# Patient Record
Sex: Female | Born: 1955 | Race: White | Hispanic: No | Marital: Married | Smoking: Never smoker
Health system: Southern US, Community
[De-identification: ages and names within clinical notes are randomized; demographics above are authoritative.]

## PROBLEM LIST (undated history)

## (undated) DIAGNOSIS — M797 Fibromyalgia: Secondary | ICD-10-CM

## (undated) DIAGNOSIS — M1990 Unspecified osteoarthritis, unspecified site: Secondary | ICD-10-CM

## (undated) DIAGNOSIS — K589 Irritable bowel syndrome without diarrhea: Secondary | ICD-10-CM

---

## 2003-03-19 ENCOUNTER — Encounter: Admission: RE | Admit: 2003-03-19 | Discharge: 2003-03-19 | Payer: Self-pay | Admitting: Allergy and Immunology

## 2003-03-19 ENCOUNTER — Encounter: Payer: Self-pay | Admitting: Allergy and Immunology

## 2005-01-18 ENCOUNTER — Encounter: Admission: RE | Admit: 2005-01-18 | Discharge: 2005-01-18 | Payer: Self-pay | Admitting: Gastroenterology

## 2005-03-06 ENCOUNTER — Ambulatory Visit (HOSPITAL_COMMUNITY): Admission: RE | Admit: 2005-03-06 | Discharge: 2005-03-06 | Payer: Self-pay | Admitting: Gastroenterology

## 2005-12-06 ENCOUNTER — Encounter: Admission: RE | Admit: 2005-12-06 | Discharge: 2005-12-06 | Payer: Self-pay | Admitting: *Deleted

## 2008-03-29 ENCOUNTER — Encounter: Admission: RE | Admit: 2008-03-29 | Discharge: 2008-03-29 | Payer: Self-pay | Admitting: Internal Medicine

## 2010-11-17 NOTE — Op Note (Signed)
NAME:  Courtney Griffin, Courtney Griffin               ACCOUNT NO.:  [id number]   MEDICAL RECORD NO.:  [id number]          PATIENT TYPE:  AMB   LOCATION:  ENDO                         FACILITY:  [facility]   PHYSICIAN:  James L. Malon Kindle., M.D.DATE OF BIRTH:  1956/02/22   DATE OF PROCEDURE:  03/06/2005  DATE OF DISCHARGE:                                 OPERATIVE REPORT   PROCEDURE:  Esophagogastroduodenoscopy.   MEDICATIONS:  Fentanyl 40 mcg, Versed 3 mg, IV.   INDICATION:  Persistent esophageal reflux.   DESCRIPTION OF PROCEDURE:  The procedure explained to the patient and  consent obtained.  Left lateral decubitus position, the scope was inserted  and advanced.  The stomach was entered.  Pylorus identified and passed.  The  duodenum, duodenal bulb and the second portion were seen well and were non-  remarkable.  The pyloric channel, antrum and body were all seen well and  were normal.  No ulceration or inflammation.  There was really only a slight  if any hiatal hernia.  No Barrett's esophagus.  A widely patent GE junction  with no stricture.  Distal esophagus was slightly red.  The scope was  withdrawn, and the proximal esophagus was normal.   ASSESSMENT:  Gastroesophageal reflux disease, 530.81.   PLAN:  1.  We are going to give a reflux sheet.  2.  Continue on current medications, specifically, PPI b.i.d.  3.  We will see back in the office in four to six weeks.           ______________________________  Llana Aliment Malon Kindle., M.D.     Waldron Session  D:  03/06/2005  T:  03/06/2005  Job:  [id number]   cc:   Joycelyn Rua, M.D.  [address] [place]  [place] [address]  Fax: [phone]

## 2014-06-10 ENCOUNTER — Other Ambulatory Visit: Payer: Self-pay | Admitting: Family Medicine

## 2014-06-10 DIAGNOSIS — J3489 Other specified disorders of nose and nasal sinuses: Secondary | ICD-10-CM

## 2014-06-11 ENCOUNTER — Other Ambulatory Visit: Payer: Self-pay | Admitting: Family Medicine

## 2014-06-11 ENCOUNTER — Ambulatory Visit (INDEPENDENT_AMBULATORY_CARE_PROVIDER_SITE_OTHER): Payer: BC Managed Care – PPO

## 2014-06-11 DIAGNOSIS — J3489 Other specified disorders of nose and nasal sinuses: Secondary | ICD-10-CM

## 2014-06-11 DIAGNOSIS — R51 Headache: Secondary | ICD-10-CM

## 2018-01-15 ENCOUNTER — Other Ambulatory Visit: Payer: Self-pay | Admitting: Physician Assistant

## 2018-01-15 DIAGNOSIS — Z1239 Encounter for other screening for malignant neoplasm of breast: Secondary | ICD-10-CM

## 2018-01-22 ENCOUNTER — Ambulatory Visit (INDEPENDENT_AMBULATORY_CARE_PROVIDER_SITE_OTHER): Payer: BLUE CROSS/BLUE SHIELD

## 2018-01-22 DIAGNOSIS — Z1231 Encounter for screening mammogram for malignant neoplasm of breast: Secondary | ICD-10-CM | POA: Diagnosis not present

## 2018-01-22 DIAGNOSIS — Z1239 Encounter for other screening for malignant neoplasm of breast: Secondary | ICD-10-CM

## 2018-03-14 ENCOUNTER — Other Ambulatory Visit: Payer: Self-pay

## 2018-03-14 ENCOUNTER — Encounter (HOSPITAL_COMMUNITY): Payer: Self-pay | Admitting: *Deleted

## 2018-03-14 ENCOUNTER — Emergency Department (HOSPITAL_COMMUNITY)
Admission: EM | Admit: 2018-03-14 | Discharge: 2018-03-14 | Disposition: A | Discharge status: Home / Self Care | Payer: BLUE CROSS/BLUE SHIELD | Attending: Emergency Medicine | Admitting: Emergency Medicine

## 2018-03-14 ENCOUNTER — Emergency Department (HOSPITAL_COMMUNITY): Payer: BLUE CROSS/BLUE SHIELD

## 2018-03-14 DIAGNOSIS — M79602 Pain in left arm: Secondary | ICD-10-CM

## 2018-03-14 DIAGNOSIS — M5412 Radiculopathy, cervical region: Secondary | ICD-10-CM

## 2018-03-14 HISTORY — DX: Irritable bowel syndrome, unspecified: K58.9

## 2018-03-14 HISTORY — DX: Fibromyalgia: M79.7

## 2018-03-14 HISTORY — DX: Unspecified osteoarthritis, unspecified site: M19.90

## 2018-03-14 LAB — BASIC METABOLIC PANEL
ANION GAP: 10 (ref 5–15)
BUN: 9 mg/dL (ref 6–20)
CALCIUM: 9.3 mg/dL (ref 8.9–10.3)
CO2: 27 mmol/L (ref 22–32)
Chloride: 103 mmol/L (ref 98–111)
Creatinine, Ser: 0.94 mg/dL (ref 0.44–1.00)
Glucose, Bld: 91 mg/dL (ref 70–99)
POTASSIUM: 4.2 mmol/L (ref 3.5–5.1)
SODIUM: 140 mmol/L (ref 135–145)

## 2018-03-14 LAB — CBC
HEMATOCRIT: 39.1 % (ref 36.0–46.0)
HEMOGLOBIN: 12.2 g/dL (ref 12.0–15.0)
MCH: 29.6 pg (ref 26.0–34.0)
MCHC: 31.2 g/dL (ref 30.0–36.0)
MCV: 94.9 fL (ref 78.0–100.0)
Platelets: 237 10*3/uL (ref 150–400)
RBC: 4.12 MIL/uL (ref 3.87–5.11)
RDW: 13.2 % (ref 11.5–15.5)
WBC: 6.5 10*3/uL (ref 4.0–10.5)

## 2018-03-14 LAB — I-STAT TROPONIN, ED: TROPONIN I, POC: 0 ng/mL (ref 0.00–0.08)

## 2018-03-14 NOTE — ED Provider Notes (Signed)
[facility]   CSN: [id number] Arrival date & time: 03/14/18  1348     History   Chief Complaint Chief Complaint  Patient presents with  . Arm Pain  . Dizziness    HPI Para [name] is a 62 y.o. female.  She is presenting to the emergency department with left arm achy burning pain that is been going on since yesterday.  It is worse with activity but never completely goes away.  There is a little bit of radiation into her chest.  It was associated with feeling nauseous especially when the pain is worse.  No shortness of breath no fever no trauma.  She does have known cervical disc disease and had an MRI about a year ago.  She was mainly concerned to make sure that she was not having a heart attack.  She is right-hand dominant.She has a history of carpal tunnel sx and this is different.   The history is provided by the patient.  Arm Pain  This is a new problem. The current episode started yesterday. The problem occurs constantly. The problem has not changed since onset.Associated symptoms include chest pain. Pertinent negatives include no abdominal pain and no shortness of breath. The symptoms are aggravated by bending, twisting and exertion. Nothing relieves the symptoms. She has tried nothing for the symptoms. The treatment provided no relief.    Past Medical History:  Diagnosis Date  . Arthritis   . Fibromyalgia   . IBS (irritable bowel syndrome)     There are no active problems to display for this patient.   History reviewed. No pertinent surgical history.   OB History   None      Home Medications    Prior to Admission medications   Not on File    Family History History reviewed. No pertinent family history.  Social History Social History   Tobacco Use  . Smoking status: Never Smoker  . Smokeless tobacco: Never Used  Substance Use Topics  . Alcohol use: Not on file  . Drug use: Not on file      Allergies   Patient has no known allergies.   Review of Systems Review of Systems  Constitutional: Negative for fever.  HENT: Negative for sore throat.   Eyes: Negative for visual disturbance.  Respiratory: Negative for shortness of breath.   Cardiovascular: Positive for chest pain. Negative for palpitations.  Gastrointestinal: Negative for abdominal pain.  Genitourinary: Negative for dysuria.  Musculoskeletal: Positive for neck pain.  Skin: Negative for rash.  Neurological: Negative for syncope.     Physical Exam Updated Vital Signs BP 136/71 (BP Location: Right Arm)   Pulse 63   Temp 98 F (36.7 C) (Oral)   Resp 20   SpO2 100%   Physical Exam  Constitutional: She is oriented to person, place, and time. She appears well-developed and well-nourished. No distress.  HENT:  Head: Normocephalic and atraumatic.  Eyes: Conjunctivae are normal.  Neck: Neck supple.  Cardiovascular: Normal rate and regular rhythm.  No murmur heard. Pulmonary/Chest: Effort normal and breath sounds normal. No respiratory distress.  Abdominal: Soft. There is no tenderness.  Musculoskeletal: Normal range of motion. She exhibits no edema, tenderness or deformity.  Neurological: She is alert and oriented to person, place, and time. She has normal strength. No cranial nerve deficit or sensory deficit. Gait normal. GCS eye subscore is 4. GCS verbal subscore is 5. GCS motor subscore is 6.  Skin: Skin  is warm and dry.  Psychiatric: She has a normal mood and affect.  Nursing note and vitals reviewed.    ED Treatments / Results  Labs (all labs ordered are listed, but only abnormal results are displayed) Labs Reviewed  BASIC METABOLIC PANEL  CBC  I-STAT TROPONIN, ED    EKG EKG Interpretation  Date/Time:  Friday March 14 2018 14:02:48 EDT Ventricular Rate:  62 PR Interval:  132 QRS Duration: 86 QT Interval:  392 QTC Calculation: 397 R Axis:   36 Text Interpretation:  Normal sinus  rhythm Low voltage QRS Borderline ECG no prior  to compare with Confirmed by Meridee Score [phone]) on 03/14/2018 5:32:31 PM   Radiology Dg Chest 2 View  Result Date: 03/14/2018 CLINICAL DATA:  Chest pain EXAM: CHEST - 2 VIEW COMPARISON:  None. FINDINGS: Normal heart size. Lungs clear. No pneumothorax. No pleural effusion. IMPRESSION: No active cardiopulmonary disease. Electronically Signed   By: Jolaine Click M.D.   On: 03/14/2018 17:33    Procedures Procedures (including critical care time)  Medications Ordered in ED Medications - No data to display   Initial Impression / Assessment and Plan / ED Course  I have reviewed the triage vital signs and the nursing notes.  Pertinent labs & imaging results that were available during my care of the patient were reviewed by me and considered in my medical decision making (see chart for details).  Clinical Course as of Mar 16 1115  Fri Mar 14, 2018  5322 62 year old female here with left arm aching pain associate with some burning from her trapezius down to her fingers.  She was concerned this might be cardiac in nature.  She is had an EKG is unremarkable along with a troponin that is normal.  Her symptoms of my constant since yesterday.  I think this pain is more likely to be some sort of radicular pain and recommended that she follow-up with her doctor for further evaluation and possible MRI.  She does not have any weakness that would require emergent imaging.  I offered steroids but she does not want to try that so she is can continue anti-inflammatories.   [MB]    Clinical Course User Index [MB] Terrilee Files, MD     Final Clinical Impressions(s) / ED Diagnoses   Final diagnoses:  Left arm pain  Cervical radicular pain    ED Discharge Orders    None       Terrilee Files, MD 03/15/18 [phone]

## 2018-03-14 NOTE — ED Triage Notes (Signed)
Pt in c/o intermittent left arm aching, some dizziness and feeling lightheaded, reports mild chest pain at times, denies vomiting but reports nausea

## 2018-03-14 NOTE — ED Notes (Signed)
Patient transported to X-ray 

## 2018-03-14 NOTE — ED Notes (Signed)
Patient given discharge instructions and verbalized understanding.  Patient stable to discharge at this time.  Patient is alert and oriented to baseline.  No distressed noted at this time.  All belongings taken with the patient at discharge.   

## 2018-03-14 NOTE — Discharge Instructions (Addendum)
Your evaluated in the emergency department for left arm pain since yesterday.  You had blood work chest x-ray and EKG and this did not show any evidence of any heart injury.  This possibly is a pinched nerve in your neck and will need follow-up with your doctor.  You should use anti-inflammatories and local ice or heat to the area.  Please return if any worsening symptoms.

## 2019-08-06 ENCOUNTER — Other Ambulatory Visit: Payer: Self-pay | Admitting: Physician Assistant

## 2019-08-06 DIAGNOSIS — Z1231 Encounter for screening mammogram for malignant neoplasm of breast: Secondary | ICD-10-CM

## 2019-08-12 ENCOUNTER — Ambulatory Visit (INDEPENDENT_AMBULATORY_CARE_PROVIDER_SITE_OTHER): Payer: BC Managed Care – PPO

## 2019-08-12 ENCOUNTER — Other Ambulatory Visit: Payer: Self-pay

## 2019-08-12 DIAGNOSIS — Z1231 Encounter for screening mammogram for malignant neoplasm of breast: Secondary | ICD-10-CM | POA: Diagnosis not present

## 2021-11-29 IMAGING — MG DIGITAL SCREENING BILAT W/ TOMO W/ CAD
8 series · 8 of 24 positions shown · non-contrast
Comparison: Previous exam(s).

CLINICAL DATA: Screening.

EXAM:
DIGITAL SCREENING BILATERAL MAMMOGRAM WITH TOMO AND CAD

[R CC synth-2D]
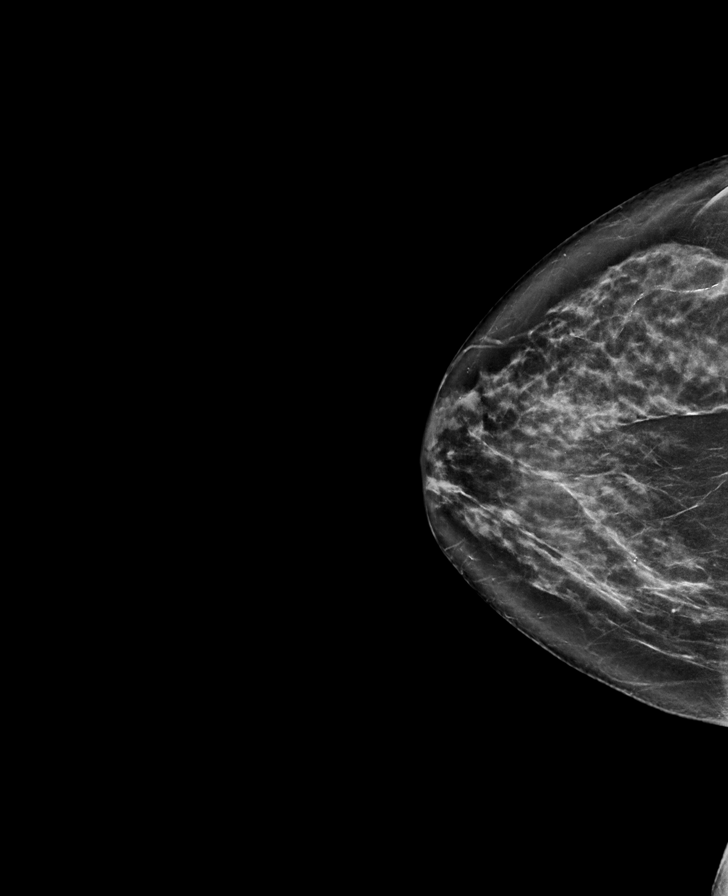

[L CC synth-2D]
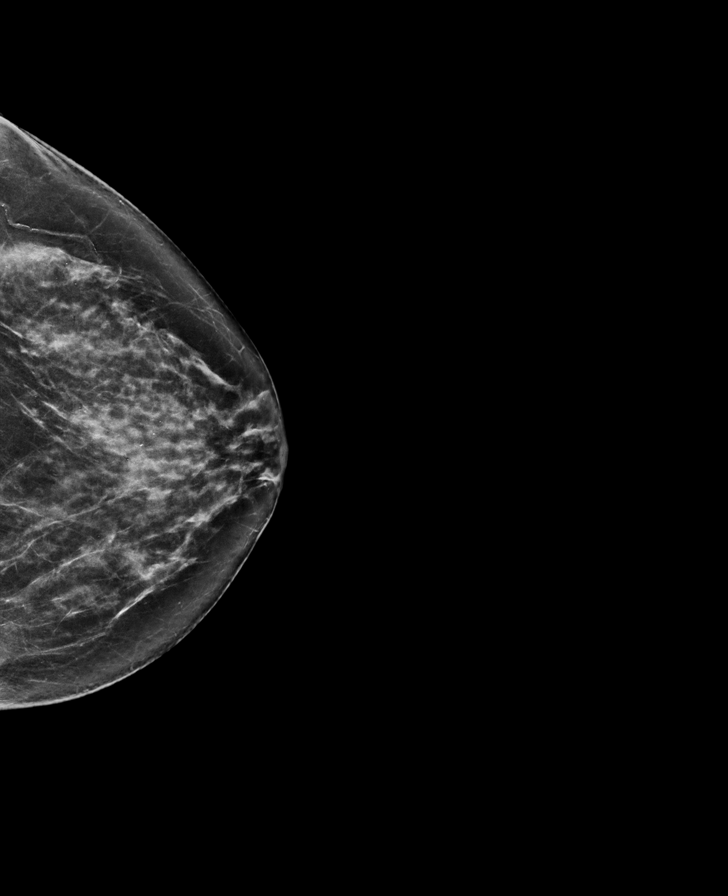

[R MLO synth-2D]
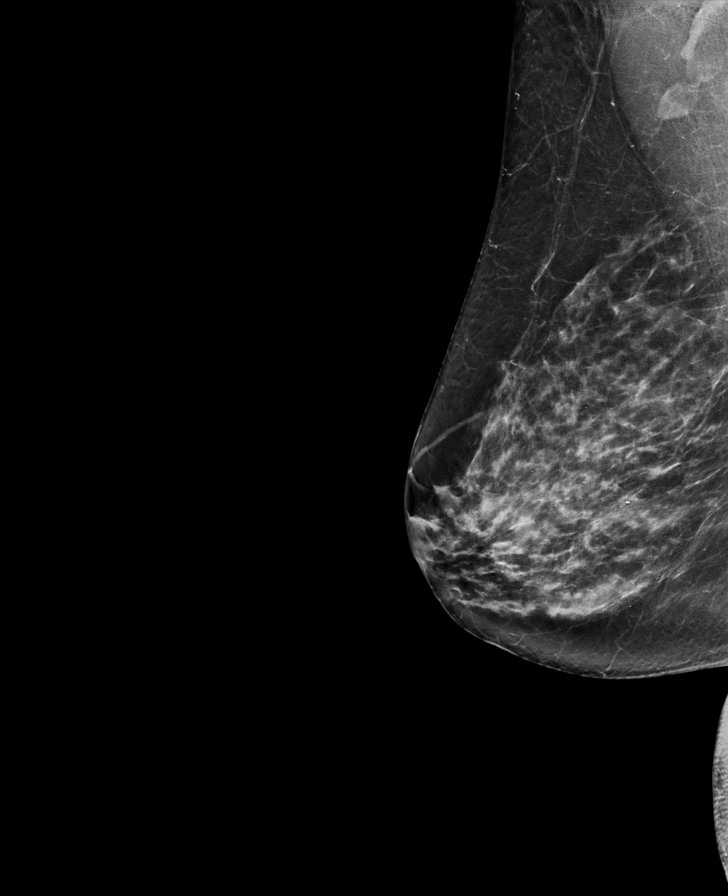

[L MLO synth-2D]
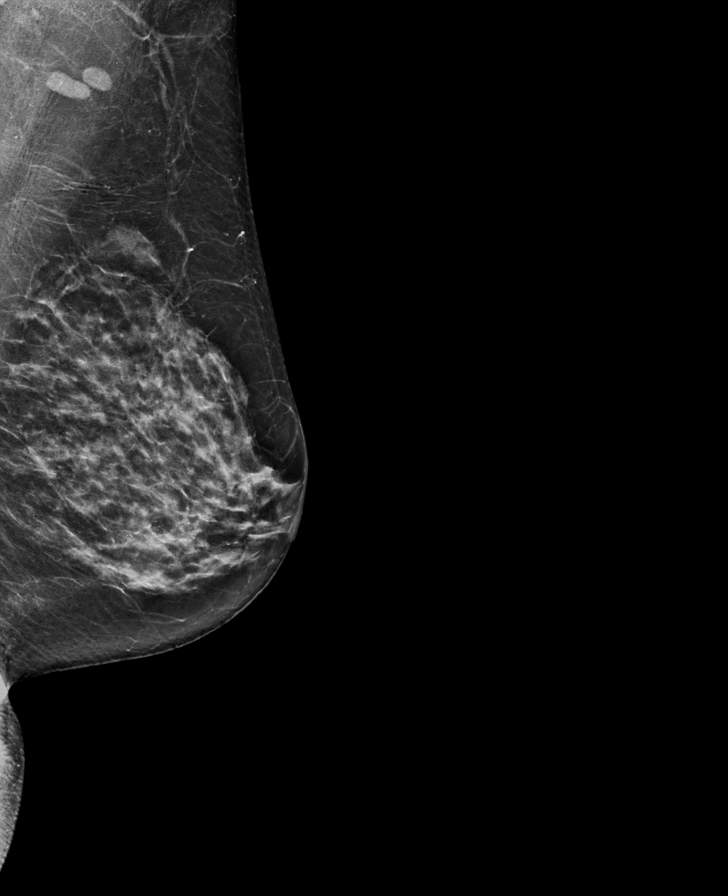

[L CC tomo · tomo slice 37/72.0]
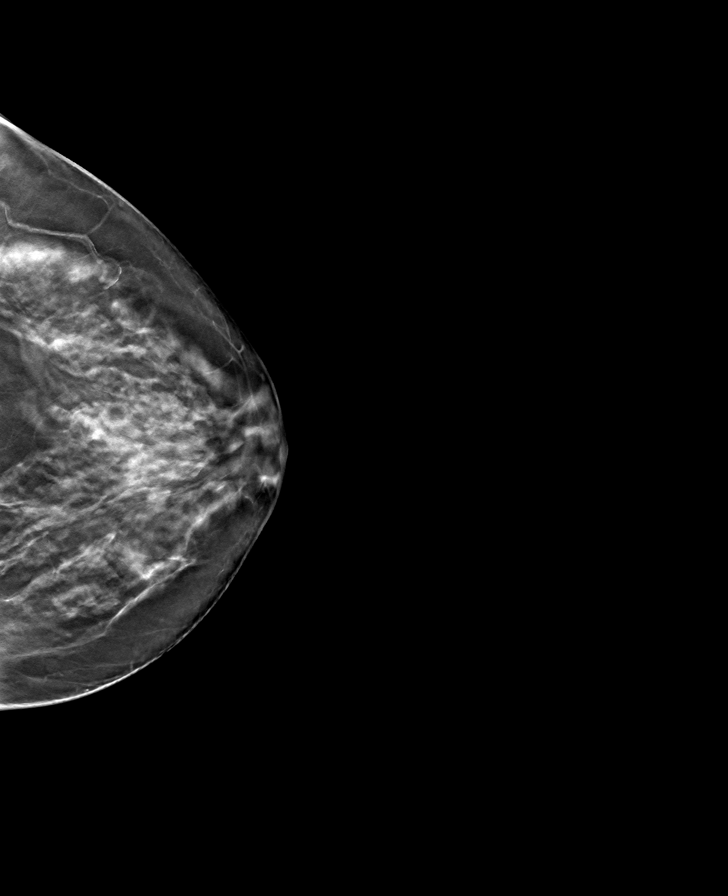

[L MLO tomo · tomo slice 35/68.0]
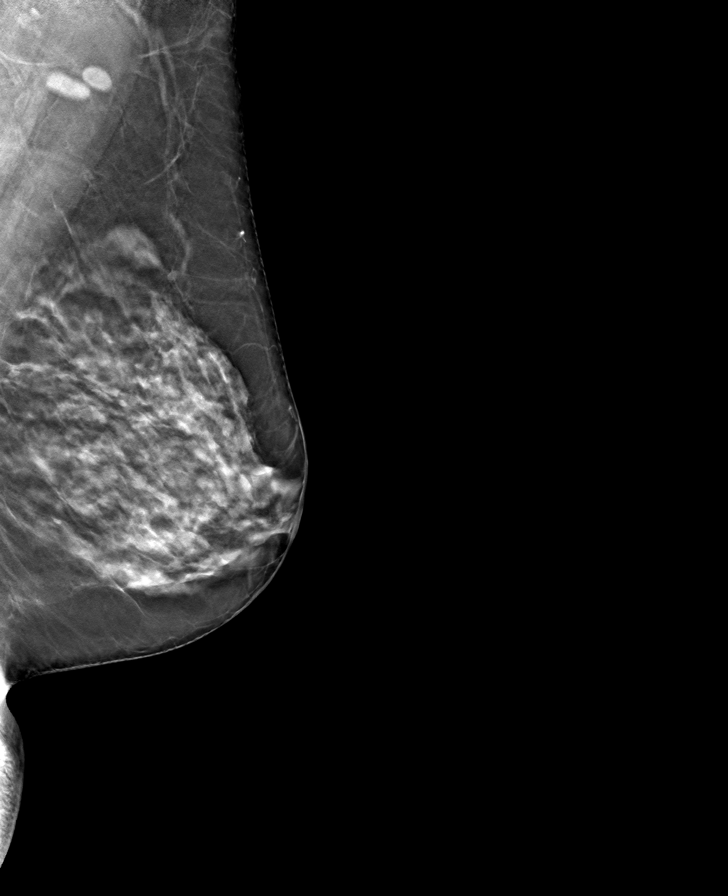

[R MLO tomo · tomo slice 37/72.0]
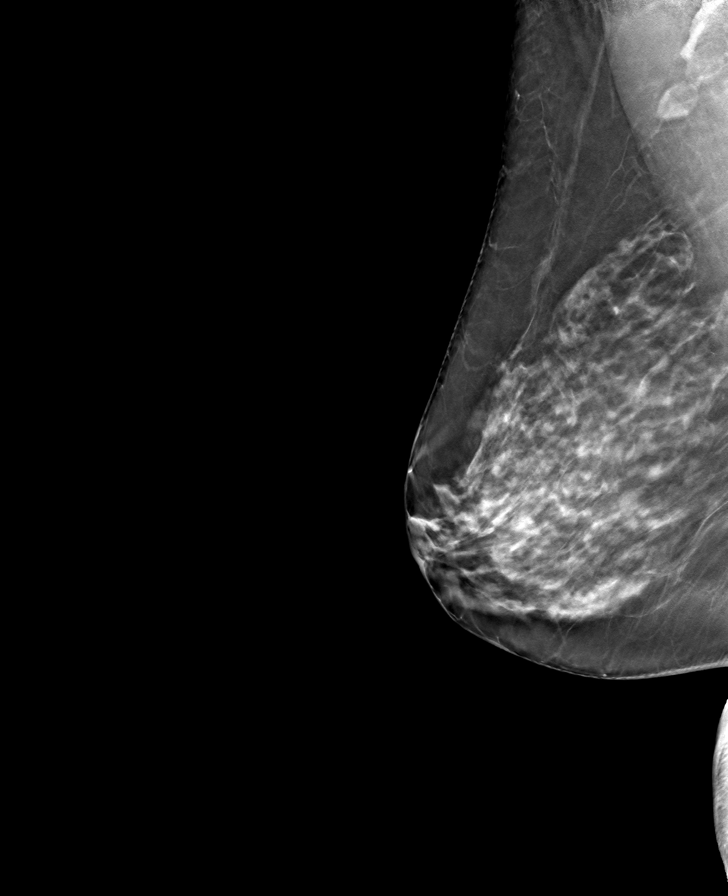

[R CC tomo · tomo slice 39/76.0]
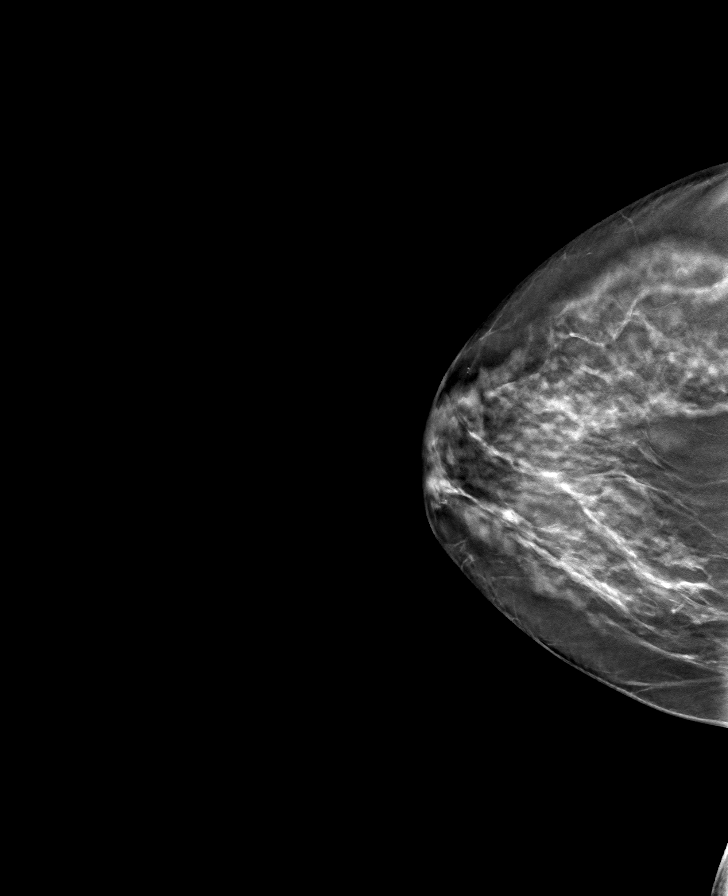

[8 of 24 positions shown; findings below may reference images not displayed]

ACR Breast Density Category c: The breast tissue is heterogeneously
dense, which may obscure small masses.
FINDINGS: There are no findings suspicious for malignancy. Images were
processed with CAD.
IMPRESSION: No mammographic evidence of malignancy. A result letter of this
screening mammogram will be mailed directly to the patient.

RECOMMENDATION:
Screening mammogram in one year. (Code:FT-U-LHB)

BI-RADS CATEGORY  1: Negative.
